# Patient Record
Sex: Male | Born: 1960 | Race: White | Hispanic: No | Marital: Married
Health system: Southern US, Community
[De-identification: ages and names within clinical notes are randomized; demographics above are authoritative.]

---

## 2014-07-11 ENCOUNTER — Ambulatory Visit: Payer: Self-pay | Admitting: Orthopedic Surgery

## 2014-08-09 ENCOUNTER — Ambulatory Visit
Admit: 2014-08-09 | Disposition: A | Discharge status: Home / Self Care | Payer: Self-pay | Attending: Anesthesiology | Admitting: Anesthesiology

## 2014-08-09 LAB — PROTIME-INR
INR: 1
Prothrombin Time: 13.4 secs

## 2014-08-09 LAB — BASIC METABOLIC PANEL WITH GFR
Anion Gap: 8
BUN: 16 mg/dL
Calcium, Total: 9.1 mg/dL
Chloride: 102 mmol/L
Co2: 29 mmol/L
Creatinine: 0.99 mg/dL
EGFR (African American): >60
EGFR (Non-African Amer.): >60
Glucose: 100 mg/dL — ABNORMAL HIGH
Potassium: 3.8 mmol/L
Sodium: 139 mmol/L

## 2014-08-09 LAB — CBC
HCT: 47 % (ref 40.0–52.0)
HGB: 15.5 g/dL (ref 13.0–18.0)
MCH: 30 pg (ref 26.0–34.0)
MCHC: 33.1 g/dL (ref 32.0–36.0)
MCV: 91 fL (ref 80–100)
Platelet: 202 10*3/uL (ref 150–440)
RBC: 5.18 10*6/uL (ref 4.40–5.90)
RDW: 13.9 % (ref 11.5–14.5)
WBC: 10.8 10*3/uL — ABNORMAL HIGH (ref 3.8–10.6)

## 2014-08-09 LAB — URINALYSIS, COMPLETE
BLOOD: NEGATIVE
Bacteria: NONE SEEN
Bilirubin,UR: NEGATIVE
Glucose,UR: NEGATIVE mg/dL (ref 0–75)
Ketone: NEGATIVE
LEUKOCYTE ESTERASE: NEGATIVE
NITRITE: NEGATIVE
PH: 6 (ref 4.5–8.0)
Protein: NEGATIVE
Specific Gravity: 1.015 (ref 1.003–1.030)
WBC UR: 1 /HPF (ref 0–5)

## 2014-08-09 LAB — APTT: Activated PTT: 31.5 secs (ref 23.6–35.9)

## 2014-08-25 ENCOUNTER — Ambulatory Visit
Admit: 2014-08-25 | Disposition: A | Discharge status: Home / Self Care | Payer: Self-pay | Attending: Orthopedic Surgery | Admitting: Orthopedic Surgery

## 2014-09-04 NOTE — Op Note (Addendum)
PATIENT NAME:  Russell Ford, QUATTRONE MR#:  [id number] DATE OF BIRTH:  17-May-1960  DATE OF PROCEDURE:  08/25/2014  PREOPERATIVE DIAGNOSIS: Right shoulder anterior and superior labral tears of the glenohumeral joint, rotator cuff tendinosis with possible rotator cuff tear.   POSTOPERATIVE DIAGNOSES: Right shoulder superior labral anteroposterior tear with anterior/inferior labral tear with rotator cuff tendinosis, subacromial bursitis and shoulder impingement.   PROCEDURE: Right shoulder arthroscopic SLAP and anterior labral repairs with subacromial debridement and decompression.   ANESTHESIA: General with right interscalene block and local with 1% lidocaine plain and 0.25% Marcaine plain.   SURGEON: Timoteo Gaul, MD.   ESTIMATED BLOOD LOSS: Minimal.   COMPLICATIONS: None.   IMPLANTS: Arthrex Bio-SutureTak anchors x 5.   INDICATIONS FOR PROCEDURE: Mr. Robarts has had remote injuries to the right shoulder including a dislocation when playing football several years ago. He works now as a Development worker, community and a Catering manager. He states he has difficulty throwing the ball due to recurrent instability and pain. MRI had demonstrated an anterior labral tear and his examination suggests possible superior labral tear as well as rotator cuff tendinosis versus a tear. The patient wished to proceed with surgical intervention given the longevity of his symptoms and failure to improve with time. I reviewed the risks and benefits of surgery with him in my office prior to the date of surgery. He understood the risks include infection, bleeding, nerve or blood vessel injury, shoulder stiffness, hardware failure, persistent pain or instability and the need for further surgery. Medical risks include, but are not limited to, DVT and pulmonary embolism, myocardial infarction, stroke, pneumonia, respiratory failure and death.   PROCEDURE NOTE: The patient was met in the preoperative area. I marked the right shoulder with  the word "yes" and my initials according to the hospital's correct site of surgery protocol. He then went to the operating room, where he was given a right interscalene block. He then underwent general endotracheal intubation. He was positioned in a beach chair position. Examination under anesthesia revealed no gross instability to the shoulder with load and shift testing and he had negative sulcus sign. Passively, he had full right shoulder range of motion. A spider arm positioner was used for this case. He was prepped and draped in a sterile fashion. A timeout was performed to verify the patient's name, date of birth, medical record number, correct site of surgery and correct procedure to be performed. It was also used to verify the patient had received antibiotics and all appropriate instruments, implants and radiographic studies were available in the room. Once all in attendance, were in agreement, the case began.   The patient's bony landmarks were drawn out with a surgical marker along with proposed arthroscopy incisions. These were preinjected with 1% lidocaine plain. An 11 blade was used to establish the posterior portal, through which to the arthroscope was placed into the glenohumeral joint. A full diagnostic examination of the shoulder was undertaken. Findings on arthroscopy included a superior labral tear without injury or tear to the biceps tendon itself. The patient also had a diminutive anterior labrum with a patulous anterior capsule. 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 The patient had fraying of the undersurface of the supraspinatus without any significant tear. His posterior labrum was intact. There were no loose bodies or Hagl lesions in the inferior recess.   An anterior portal had been established during the diagnostic portion of the case. This was created using an 18-gauge spinal needle  for localization. Through this anterior portal, a 5.75 mm arthroscopic cannula was  placed. A second cannula was then inserted inferior to the first portal, again using an 18-gauge spinal needle for localization. Through this, a partially threaded 8 mm arthroscopic cannula was placed. The attention was turned to addressing the anterior/inferior labrum first. An arthroscopic elevator was used to immobilize the anterior-inferior labrum. A 4-0 resector shaver blade was then placed through the 8 mm cannula and the anterior glenoid was burred to remove all scar and soft tissue. Punctate bleeding was identified, which will help healing of the labral repair.   Three Arthrex Bio-SutureTak anchors were then placed into the anterior labrum sequentially. The first one was at approximately the 5 o'clock position, the next one at approximately the 4 o'clock position and the third at approximately the 3 o'clock position. An arthroscopic drill was used to create the hole for the anchor. The Bio-SutureTak anchor was then gently malleted into position. A single limb of the anchor was then brought through a portion of the capsule and labrum. This suture was shuttled using a 90 degree suture lasso. The arthroscopic knot-tying technique was then used to complete the repair which included a capsulorrhaphy . This re-established an anterior bumper. The tension on the anterior/inferior glenohumeral ligament was also re-established.   Once the anterior labrum had been repaired and completed, the attention was turned to the SLAP lesion. The first Bio-SutureTak anchor was placed just anterior to the biceps tendon. The same technique for anchor placement was used as described above. A drill was used to create the insertion point for the anchor at the articular margin of the superior glenoid. A Bio-SutureTak anchor was then inserted and gently malleted into position. A single limb of this anchor was then shuttled underneath the superior labrum using a 90 degree suture lasso. The repair was then completed using an  arthroscopic knot-tying technique.   The posterior anchor for the superior labral repair was then placed using a percutaneous technique using the Arthrex percutaneous set. A long spinal needle was then inserted. This allowed for placement of a Nitinol wire. A cannulated inner sleeve was then placed over the Nitinol wire, which then allowed for the drill guide to be placed over top of this. This allowed for an excellent approach to the posterior aspect of thesuperior glenoid just posterior to the biceps tendon attachment. A second Bio-SutureTak anchor was placed just posterior to the biceps anchor. Again, passage of the single limb of the Bio-SutureTak anchor was performed using a 90 degree suture lasso. Again, an arthroscopic knot-tying technique was performed. A hook probe was then used to probe the superior and anterior labral repairs and deemed to be extremely stable. Final images were taken of the repairs.   The arthroscope was then removed and placed into the subacromial space. A lateral portal was established using an 18-gauge spinal needle. A shaver blade was placed through the lateral portal and an extensive bursectomy was performed along with a 90 degree ArthroCare wand. The patient was found to have an anterolateral subacromial spur, which was then removed using a 5.5 mm resector shaver blade to perform a subacromial decompression. Final images were taken. The rotator cuff from the bursal side was examined using a hook probe through the lateral portal. No tears were identified. The subdural space was copiously irrigated, all arthroscopic instruments were removed.   The 4 arthroscopic portals were closed with 4-0 nylon and a dry sterile dressing was applied along with TENS  unit leads, a Polar Care sleeve and an abduction sling. The patient was awakened and brought to the PACU in stable condition. I was scrubbed and present for the entire case and all sharp and instrument counts were correct at the  conclusion of the case. I met with the patient's wife to let her know the case had gone without complication and her husband was stable in the recovery room.     ____________________________ Timoteo Gaul, MD klk:TT D: 08/28/2014 17:21:23 ET T: 08/28/2014 17:54:48 ET JOB#: [id number]  cc: Timoteo Gaul, MD, <Dictator> Timoteo Gaul MD ELECTRONICALLY SIGNED 09/05/2014 13:08

## 2016-10-24 IMAGING — RF DG ARTHROGRAM INJ W/FG MRI/CT SHOULDER*R*
1 series · 1 of 1 positions shown · IV contrast (magnevist)
Comparison: None.

CLINICAL DATA: Shoulder pain.

EXAM:
DG ARTHROGRAM INJ W/FG MRI/CT SHOULDER RIGHT
TECHNIQUE: After discussing the risks and benefits of this procedure the
patient for consent obtained. Right shoulder sterilely prepped and
draped. Following local anesthesia with 1% lidocaine a 22 gauge
spinal needle was advanced into the right shoulder under
fluoroscopic guidance and a standardized mixture of saline, nonionic
contrast, and Magnevist administered.

[Series 1: cp_standard · 0.20mm/px · 1 of 1 slices shown]
[im 1/1]
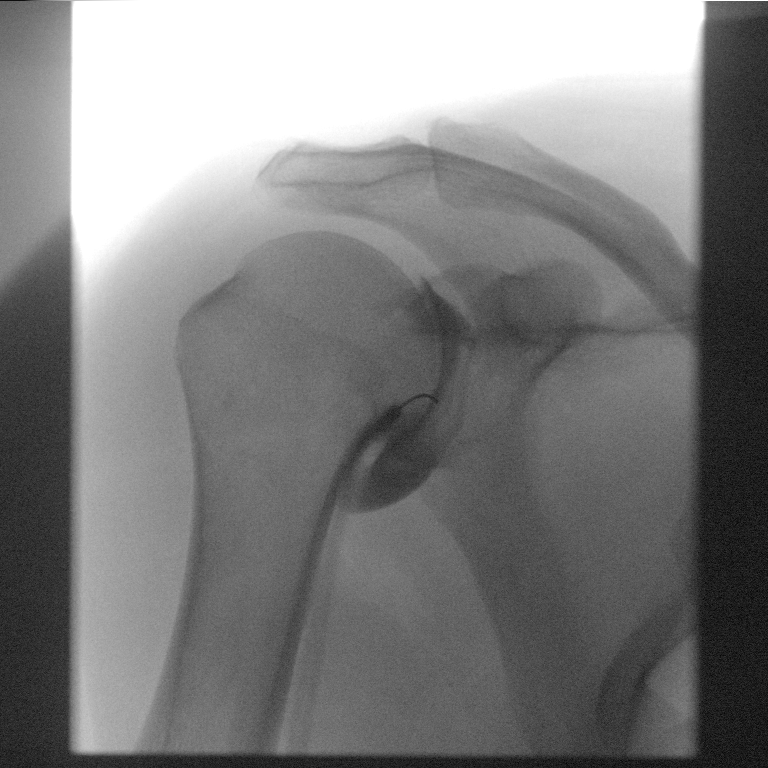

[1 of 1 positions shown; findings below may reference images not displayed]

FINDINGS: Successful right shoulder injection for right shoulder MRI. No
complications.
IMPRESSION: Successful right shoulder injection for right shoulder MRI.

## 2016-10-24 IMAGING — MR MR ARTHROGRAM SHOULDER*R*
5 series · 40 of 40 positions shown · IV contrast (agent unspecified)
Comparison: Radiograph 07/11/2014

CLINICAL DATA: Chronic dislocations.

EXAM:
MR ARTHROGRAM OF THE right SHOULDER
TECHNIQUE: Multiplanar, multisequence MR imaging of the right shoulder was
performed following the administration of intra-articular contrast.
CONTRAST:  See Injection Documentation.

[Series 3: T1 fat-sat · axial · 4.0mm · 0.47mm/px · z∈[-18,+70]mm · 8 of 21 slices shown (1 of 3)]
[im 1/21]
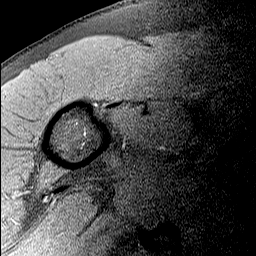
[im 3/21]
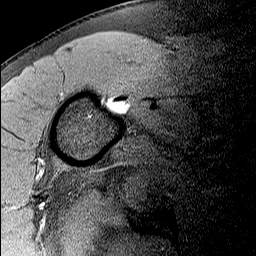
[im 6/21]
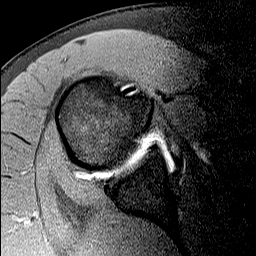
[im 9/21]
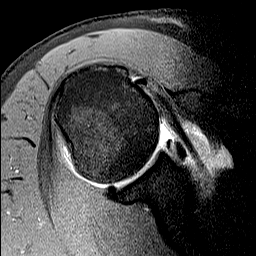
[im 12/21]
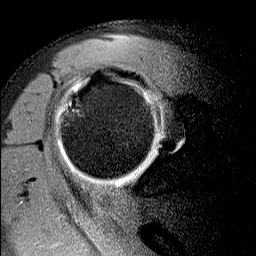
[im 15/21]
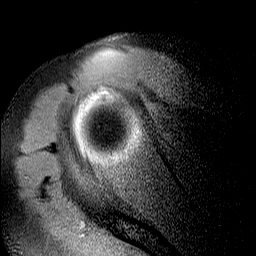
[im 18/21]
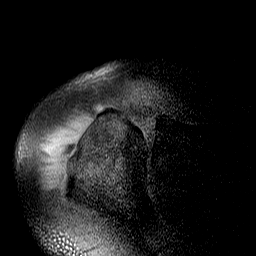
[im 21/21]
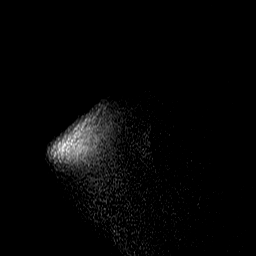

[Series 4: T1 fat-sat · oblique · 4.0mm · 0.62mm/px · 8 of 19 slices shown (2 of 3)]
[im 1/19]
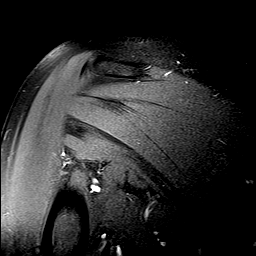
[im 3/19]
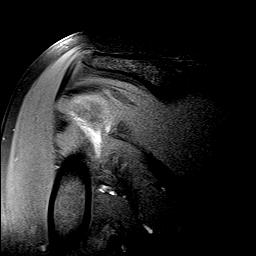
[im 6/19]
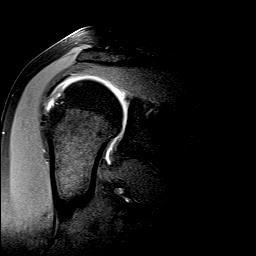
[im 8/19]
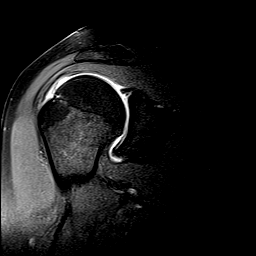
[im 11/19]
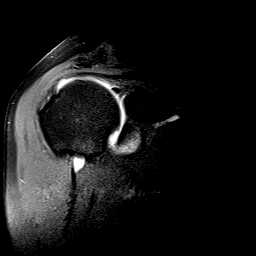
[im 13/19]
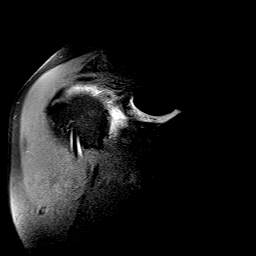
[im 16/19]
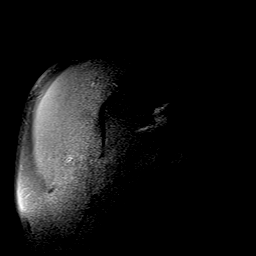
[im 19/19]
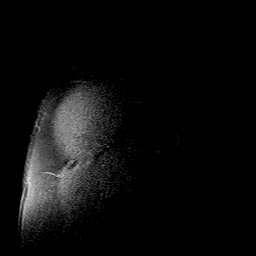

[Series 5: T2 fat-sat · oblique · 4.0mm · 0.62mm/px · 8 of 19 slices shown (1 of 2)]
[im 1/19]
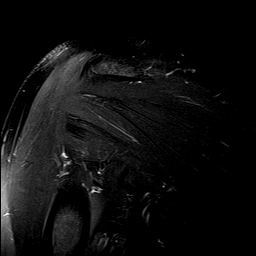
[im 3/19]
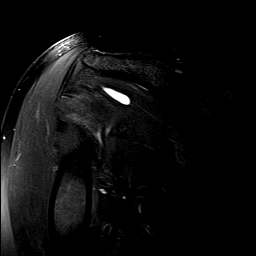
[im 6/19]
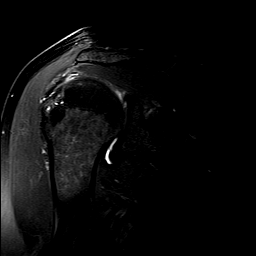
[im 8/19]
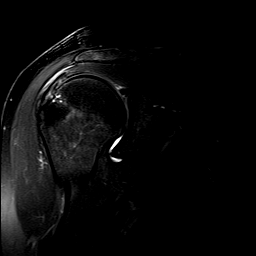
[im 11/19]
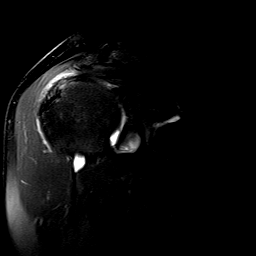
[im 13/19]
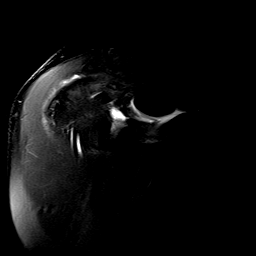
[im 16/19]
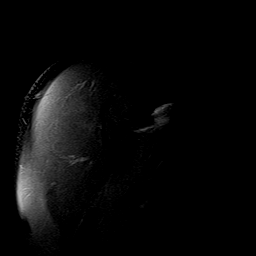
[im 19/19]
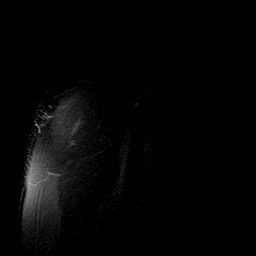

[Series 6: T1 fat-sat · oblique · non-contrast · 4.0mm · 0.50mm/px · 8 of 19 slices shown (3 of 3)]
[im 1/19]
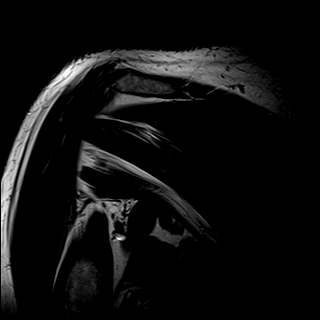
[im 3/19]
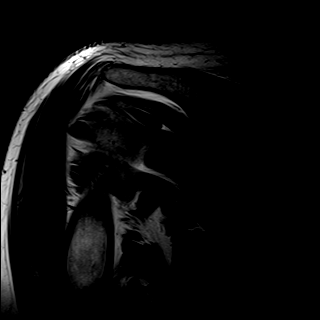
[im 6/19]
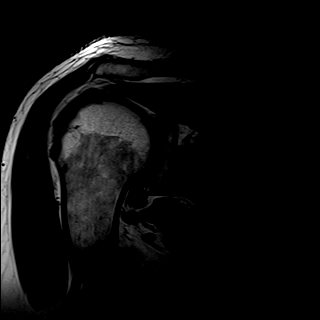
[im 8/19]
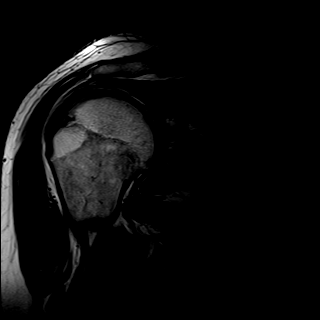
[im 11/19]
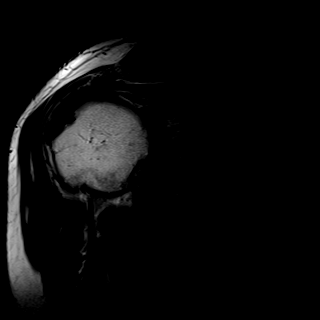
[im 13/19]
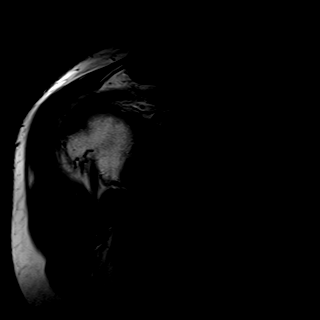
[im 16/19]
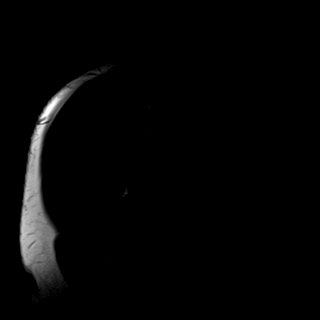
[im 19/19]
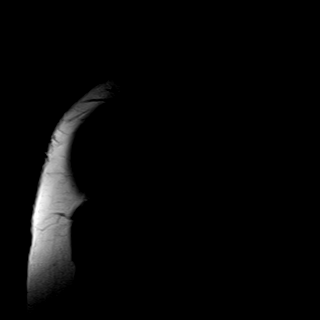

[Series 7: T2 fat-sat · oblique · 4.0mm · 0.62mm/px · 8 of 19 slices shown (2 of 2)]
[im 1/19]
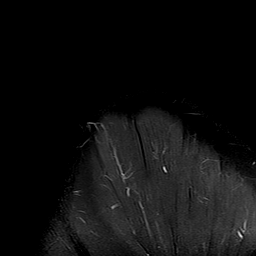
[im 3/19]
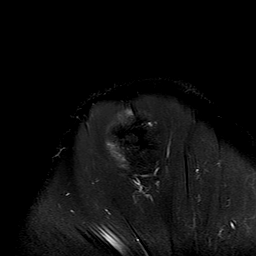
[im 6/19]
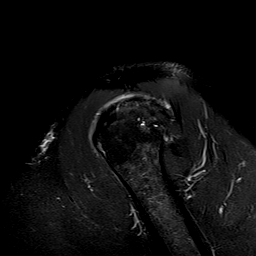
[im 8/19]
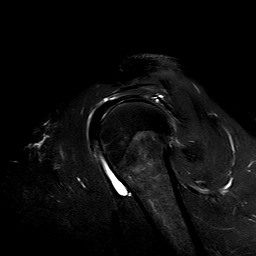
[im 11/19]
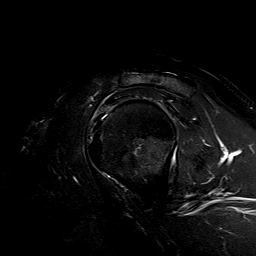
[im 13/19]
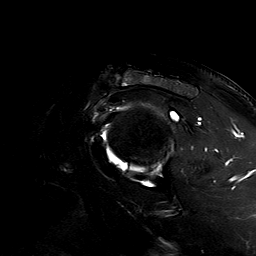
[im 16/19]
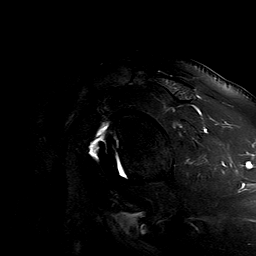
[im 19/19]
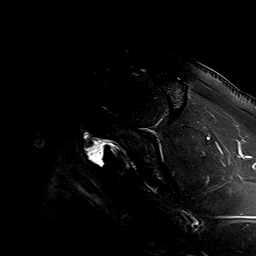

[40 of 40 positions shown; findings below may reference images not displayed]

FINDINGS: Rotator cuff: Moderate rotator cuff tendinopathy/ tendinosis. There
are articular surface tears involving the infraspinatus and
supraspinatus tendons. There is a intrasubstance ganglion cyst
tracking back along the infraspinatus musculotendinous junction
likely communicating with the articular surface tear. No full
thickness retracted tear. The subscapularis tendon is intact.
Moderate tendinopathy.

Muscles: Normal except for the ganglion cyst in the infraspinatus.

Biceps long head: Normal.

Acromioclavicular Joint: Mild to moderate degenerative changes. The
acromion is type 2 in shape. No significant lateral downsloping or
undersurface spurring.

Glenohumeral Joint: Minimal degenerative changes. No cartilage
defect or osteochondral abnormality.

Labrum: The anterior labrum is torn/stripped from the glenoid for
approximately the 2 o'clock to 5 o'clock position. There is
degenerative fraying type changes involving the superior labrum
posteriorly.

Bones: No acute bony findings.

Other:  Mild to moderate subacromial/ subdeltoid bursitis.
IMPRESSION: 1. Significant rotator cuff tendinopathy/tendinosis with several
shallow articular surface tears involving the supraspinatus and
infraspinatus tendons. No full-thickness retracted tear.
2. Anterior labral tear.
3. Intact long head biceps tendon.
4. Mild to moderate subacromial/subdeltoid bursitis.
5. No significant findings for bony impingement.

## 2023-09-04 DEATH — deceased
